# Patient Record
Sex: Male | Born: 1970 | Race: White | Hispanic: No | Smoking: Never smoker
Health system: Southern US, Community
[De-identification: ages and names within clinical notes are randomized; demographics above are authoritative.]

## PROBLEM LIST (undated history)

## (undated) DIAGNOSIS — S060XAA Concussion with loss of consciousness status unknown, initial encounter: Secondary | ICD-10-CM

## (undated) DIAGNOSIS — S060X9A Concussion with loss of consciousness of unspecified duration, initial encounter: Secondary | ICD-10-CM

## (undated) DIAGNOSIS — Z789 Other specified health status: Secondary | ICD-10-CM

## (undated) HISTORY — PX: MOUTH SURGERY: SHX715

## (undated) HISTORY — PX: TONSILLECTOMY: SUR1361

## (undated) HISTORY — DX: Concussion with loss of consciousness of unspecified duration, initial encounter: S06.0X9A

## (undated) HISTORY — DX: Concussion with loss of consciousness status unknown, initial encounter: S06.0XAA

---

## 2020-03-23 ENCOUNTER — Ambulatory Visit (HOSPITAL_COMMUNITY)
Admission: RE | Admit: 2020-03-23 | Discharge: 2020-03-23 | Disposition: A | Discharge status: Home / Self Care | Payer: No Typology Code available for payment source | Attending: Urology | Admitting: Urology

## 2020-03-23 ENCOUNTER — Emergency Department (HOSPITAL_COMMUNITY): Payer: No Typology Code available for payment source | Admitting: Certified Registered Nurse Anesthetist

## 2020-03-23 ENCOUNTER — Emergency Department (HOSPITAL_COMMUNITY): Payer: No Typology Code available for payment source

## 2020-03-23 ENCOUNTER — Encounter (HOSPITAL_COMMUNITY): Payer: Self-pay

## 2020-03-23 ENCOUNTER — Other Ambulatory Visit: Payer: Self-pay

## 2020-03-23 ENCOUNTER — Encounter (HOSPITAL_COMMUNITY)
Admission: RE | Disposition: A | Discharge status: Home / Self Care | Payer: Self-pay | Source: Home / Self Care | Attending: Emergency Medicine

## 2020-03-23 DIAGNOSIS — R52 Pain, unspecified: Secondary | ICD-10-CM | POA: Diagnosis present

## 2020-03-23 DIAGNOSIS — Z20822 Contact with and (suspected) exposure to covid-19: Secondary | ICD-10-CM | POA: Insufficient documentation

## 2020-03-23 DIAGNOSIS — S060X1A Concussion with loss of consciousness of 30 minutes or less, initial encounter: Secondary | ICD-10-CM | POA: Diagnosis present

## 2020-03-23 DIAGNOSIS — Y939 Activity, unspecified: Secondary | ICD-10-CM | POA: Insufficient documentation

## 2020-03-23 DIAGNOSIS — Z87891 Personal history of nicotine dependence: Secondary | ICD-10-CM | POA: Diagnosis not present

## 2020-03-23 DIAGNOSIS — S3022XA Contusion of scrotum and testes, initial encounter: Secondary | ICD-10-CM | POA: Diagnosis not present

## 2020-03-23 DIAGNOSIS — S3994XA Unspecified injury of external genitals, initial encounter: Secondary | ICD-10-CM | POA: Diagnosis present

## 2020-03-23 HISTORY — DX: Other specified health status: Z78.9

## 2020-03-23 HISTORY — PX: SCROTAL EXPLORATION: SHX2386

## 2020-03-23 LAB — URINALYSIS, ROUTINE W REFLEX MICROSCOPIC
Bacteria, UA: NONE SEEN
Bilirubin Urine: NEGATIVE
Glucose, UA: NEGATIVE mg/dL
Ketones, ur: NEGATIVE mg/dL
Leukocytes,Ua: NEGATIVE
Nitrite: NEGATIVE
Protein, ur: NEGATIVE mg/dL
Specific Gravity, Urine: 1.023 (ref 1.005–1.030)
pH: 6 (ref 5.0–8.0)

## 2020-03-23 LAB — CBC
HCT: 47.9 % (ref 39.0–52.0)
Hemoglobin: 15 g/dL (ref 13.0–17.0)
MCH: 27.7 pg (ref 26.0–34.0)
MCHC: 31.3 g/dL (ref 30.0–36.0)
MCV: 88.5 fL (ref 80.0–100.0)
Platelets: 207 10*3/uL (ref 150–400)
RBC: 5.41 MIL/uL (ref 4.22–5.81)
RDW: 12.9 % (ref 11.5–15.5)
WBC: 7.8 10*3/uL (ref 4.0–10.5)
nRBC: 0 % (ref 0.0–0.2)

## 2020-03-23 LAB — COMPREHENSIVE METABOLIC PANEL
ALT: 45 U/L — ABNORMAL HIGH (ref 0–44)
AST: 32 U/L (ref 15–41)
Albumin: 3.9 g/dL (ref 3.5–5.0)
Alkaline Phosphatase: 56 U/L (ref 38–126)
Anion gap: 11 (ref 5–15)
BUN: 17 mg/dL (ref 6–20)
CO2: 22 mmol/L (ref 22–32)
Calcium: 9.1 mg/dL (ref 8.9–10.3)
Chloride: 107 mmol/L (ref 98–111)
Creatinine, Ser: 1.21 mg/dL (ref 0.61–1.24)
GFR, Estimated: >60 mL/min (ref >60)
Glucose, Bld: 125 mg/dL — ABNORMAL HIGH (ref 70–99)
Potassium: 4.3 mmol/L (ref 3.5–5.1)
Sodium: 140 mmol/L (ref 135–145)
Total Bilirubin: 0.6 mg/dL (ref 0.3–1.2)
Total Protein: 6.6 g/dL (ref 6.5–8.1)

## 2020-03-23 LAB — I-STAT CHEM 8, ED
BUN: 20 mg/dL (ref 6–20)
Calcium, Ion: 1.04 mmol/L — ABNORMAL LOW (ref 1.15–1.40)
Chloride: 107 mmol/L (ref 98–111)
Creatinine, Ser: 1 mg/dL (ref 0.61–1.24)
Glucose, Bld: 122 mg/dL — ABNORMAL HIGH (ref 70–99)
HCT: 45 % (ref 39.0–52.0)
Hemoglobin: 15.3 g/dL (ref 13.0–17.0)
Potassium: 4.2 mmol/L (ref 3.5–5.1)
Sodium: 140 mmol/L (ref 135–145)
TCO2: 22 mmol/L (ref 22–32)

## 2020-03-23 LAB — PROTIME-INR
INR: 1 (ref 0.8–1.2)
Prothrombin Time: 13.1 seconds (ref 11.4–15.2)

## 2020-03-23 LAB — RESP PANEL BY RT-PCR (FLU A&B, COVID) ARPGX2
Influenza A by PCR: NEGATIVE
Influenza B by PCR: NEGATIVE
SARS Coronavirus 2 by RT PCR: NEGATIVE

## 2020-03-23 LAB — ETHANOL: Alcohol, Ethyl (B): <10 mg/dL (ref <10)

## 2020-03-23 LAB — SAMPLE TO BLOOD BANK

## 2020-03-23 SURGERY — EXPLORATION, SCROTUM
Anesthesia: General

## 2020-03-23 MED ORDER — DEXAMETHASONE SODIUM PHOSPHATE 10 MG/ML IJ SOLN
INTRAMUSCULAR | Status: AC
  Filled 2020-03-23: qty 2

## 2020-03-23 MED ORDER — BUPIVACAINE HCL (PF) 0.25 % IJ SOLN
INTRAMUSCULAR | Status: DC | PRN
  Administered 2020-03-23: 10 mL

## 2020-03-23 MED ORDER — BUPIVACAINE HCL (PF) 0.25 % IJ SOLN
INTRAMUSCULAR | Status: AC
  Filled 2020-03-23: qty 30

## 2020-03-23 MED ORDER — SODIUM CHLORIDE 0.9 % IV BOLUS
1000.0000 mL | Freq: Once | INTRAVENOUS | Status: AC
  Administered 2020-03-23: 1000 mL via INTRAVENOUS

## 2020-03-23 MED ORDER — ROCURONIUM BROMIDE 10 MG/ML (PF) SYRINGE
PREFILLED_SYRINGE | INTRAVENOUS | Status: AC
  Filled 2020-03-23: qty 30

## 2020-03-23 MED ORDER — ONDANSETRON HCL 4 MG/2ML IJ SOLN
INTRAMUSCULAR | Status: DC | PRN
  Administered 2020-03-23: 4 mg via INTRAVENOUS

## 2020-03-23 MED ORDER — ONDANSETRON HCL 4 MG/2ML IJ SOLN
INTRAMUSCULAR | Status: AC
  Filled 2020-03-23: qty 4

## 2020-03-23 MED ORDER — CHLORHEXIDINE GLUCONATE 0.12 % MT SOLN
OROMUCOSAL | Status: AC
  Administered 2020-03-23: 15 mL
  Filled 2020-03-23: qty 15

## 2020-03-23 MED ORDER — IOHEXOL 300 MG/ML  SOLN
99.0000 mL | Freq: Once | INTRAMUSCULAR | Status: AC | PRN
  Administered 2020-03-23: 99 mL via INTRAVENOUS

## 2020-03-23 MED ORDER — CEFAZOLIN SODIUM-DEXTROSE 2-3 GM-%(50ML) IV SOLR
INTRAVENOUS | Status: DC | PRN
  Administered 2020-03-23: 2 g via INTRAVENOUS

## 2020-03-23 MED ORDER — DROPERIDOL 2.5 MG/ML IJ SOLN: 0.6250 mg | Freq: Once | INTRAMUSCULAR | Status: DC | PRN

## 2020-03-23 MED ORDER — FENTANYL CITRATE (PF) 100 MCG/2ML IJ SOLN: 50.0000 ug | Freq: Once | INTRAMUSCULAR | Status: AC

## 2020-03-23 MED ORDER — LIDOCAINE 2% (20 MG/ML) 5 ML SYRINGE
INTRAMUSCULAR | Status: AC
  Filled 2020-03-23: qty 15

## 2020-03-23 MED ORDER — PHENYLEPHRINE 40 MCG/ML (10ML) SYRINGE FOR IV PUSH (FOR BLOOD PRESSURE SUPPORT)
PREFILLED_SYRINGE | INTRAVENOUS | Status: AC
  Filled 2020-03-23: qty 20

## 2020-03-23 MED ORDER — CELECOXIB 200 MG PO CAPS: 200.0000 mg | ORAL_CAPSULE | Freq: Once | ORAL | Status: AC

## 2020-03-23 MED ORDER — ACETAMINOPHEN 500 MG PO TABS
ORAL_TABLET | ORAL | Status: AC
  Administered 2020-03-23: 1000 mg via ORAL
  Filled 2020-03-23: qty 2

## 2020-03-23 MED ORDER — 0.9 % SODIUM CHLORIDE (POUR BTL) OPTIME
TOPICAL | Status: DC | PRN
  Administered 2020-03-23: 1000 mL

## 2020-03-23 MED ORDER — PROPOFOL 10 MG/ML IV BOLUS
INTRAVENOUS | Status: DC | PRN
  Administered 2020-03-23: 200 mg via INTRAVENOUS

## 2020-03-23 MED ORDER — DEXAMETHASONE SODIUM PHOSPHATE 10 MG/ML IJ SOLN
INTRAMUSCULAR | Status: DC | PRN
  Administered 2020-03-23: 10 mg via INTRAVENOUS

## 2020-03-23 MED ORDER — LIDOCAINE 2% (20 MG/ML) 5 ML SYRINGE
INTRAMUSCULAR | Status: DC | PRN
  Administered 2020-03-23: 60 mg via INTRAVENOUS

## 2020-03-23 MED ORDER — MIDAZOLAM HCL 2 MG/2ML IJ SOLN
2.0000 mg | Freq: Once | INTRAMUSCULAR | Status: AC
  Administered 2020-03-23: 2 mg via INTRAVENOUS

## 2020-03-23 MED ORDER — MIDAZOLAM HCL 2 MG/2ML IJ SOLN
INTRAMUSCULAR | Status: AC
  Filled 2020-03-23: qty 2

## 2020-03-23 MED ORDER — LACTATED RINGERS IV SOLN: INTRAVENOUS | Status: DC | PRN

## 2020-03-23 MED ORDER — EPHEDRINE 5 MG/ML INJ
INTRAVENOUS | Status: AC
  Filled 2020-03-23: qty 10

## 2020-03-23 MED ORDER — MEPERIDINE HCL 25 MG/ML IJ SOLN: 6.2500 mg | INTRAMUSCULAR | Status: DC | PRN

## 2020-03-23 MED ORDER — CEFAZOLIN SODIUM-DEXTROSE 2-4 GM/100ML-% IV SOLN
INTRAVENOUS | Status: AC
  Filled 2020-03-23: qty 100

## 2020-03-23 MED ORDER — PHENYLEPHRINE 40 MCG/ML (10ML) SYRINGE FOR IV PUSH (FOR BLOOD PRESSURE SUPPORT)
PREFILLED_SYRINGE | INTRAVENOUS | Status: AC
  Filled 2020-03-23: qty 10

## 2020-03-23 MED ORDER — FENTANYL CITRATE (PF) 100 MCG/2ML IJ SOLN
INTRAMUSCULAR | Status: AC
  Administered 2020-03-23: 50 ug via INTRAVENOUS
  Filled 2020-03-23: qty 2

## 2020-03-23 MED ORDER — ACETAMINOPHEN 500 MG PO TABS: 1000.0000 mg | ORAL_TABLET | Freq: Once | ORAL | Status: AC

## 2020-03-23 MED ORDER — FENTANYL CITRATE (PF) 250 MCG/5ML IJ SOLN
INTRAMUSCULAR | Status: DC | PRN
  Administered 2020-03-23 (×3): 50 ug via INTRAVENOUS

## 2020-03-23 MED ORDER — PROPOFOL 10 MG/ML IV BOLUS
INTRAVENOUS | Status: AC
  Filled 2020-03-23: qty 20

## 2020-03-23 MED ORDER — SUCCINYLCHOLINE CHLORIDE 200 MG/10ML IV SOSY
PREFILLED_SYRINGE | INTRAVENOUS | Status: AC
  Filled 2020-03-23: qty 10

## 2020-03-23 MED ORDER — FENTANYL CITRATE (PF) 250 MCG/5ML IJ SOLN
INTRAMUSCULAR | Status: AC
  Filled 2020-03-23: qty 5

## 2020-03-23 MED ORDER — MIDAZOLAM HCL 2 MG/2ML IJ SOLN
INTRAMUSCULAR | Status: DC | PRN
  Administered 2020-03-23: 2 mg via INTRAVENOUS

## 2020-03-23 MED ORDER — HYDROCODONE-ACETAMINOPHEN 5-325 MG PO TABS
1.0000 | ORAL_TABLET | ORAL | 0 refills | Status: DC | PRN
Start: 2020-03-23 — End: 2020-04-09

## 2020-03-23 MED ORDER — CELECOXIB 200 MG PO CAPS
ORAL_CAPSULE | ORAL | Status: AC
  Administered 2020-03-23: 200 mg via ORAL
  Filled 2020-03-23: qty 1

## 2020-03-23 MED ORDER — HYDROMORPHONE HCL 1 MG/ML IJ SOLN
0.2500 mg | INTRAMUSCULAR | Status: DC | PRN
Start: 2020-03-23 — End: 2020-03-24

## 2020-03-23 SURGICAL SUPPLY — 22 items
BLADE CLIPPER SURG (BLADE) ×3 IMPLANT
BLADE HEX COATED 2.75 (ELECTRODE) ×3 IMPLANT
COVER SURGICAL LIGHT HANDLE (MISCELLANEOUS) ×6 IMPLANT
DERMABOND ADVANCED (GAUZE/BANDAGES/DRESSINGS) ×2
DERMABOND ADVANCED .7 DNX12 (GAUZE/BANDAGES/DRESSINGS) ×1 IMPLANT
DRAPE LAPAROTOMY 100X72 PEDS (DRAPES) ×3 IMPLANT
ELECT REM PT RETURN 15FT ADLT (MISCELLANEOUS) ×3 IMPLANT
GAUZE SPONGE 4X4 12PLY STRL (GAUZE/BANDAGES/DRESSINGS) ×3 IMPLANT
GOWN STRL REUS W/ TWL LRG LVL3 (GOWN DISPOSABLE) ×1 IMPLANT
GOWN STRL REUS W/TWL LRG LVL3 (GOWN DISPOSABLE) ×2
KIT BASIN OR (CUSTOM PROCEDURE TRAY) ×3 IMPLANT
NEEDLE HYPO 22GX1.5 SAFETY (NEEDLE) ×3 IMPLANT
NS IRRIG 1000ML POUR BTL (IV SOLUTION) ×3 IMPLANT
PACK GENERAL/GYN (CUSTOM PROCEDURE TRAY) ×3 IMPLANT
SOL PREP POV-IOD 4OZ 10% (MISCELLANEOUS) ×3 IMPLANT
SUPPORT SCROTAL LG STRP (MISCELLANEOUS) IMPLANT
SUPPORTER ATHLETIC LG (MISCELLANEOUS)
SUT CHROMIC 3 0 SH 27 (SUTURE) ×3 IMPLANT
SUT VIC AB 2-0 SH 27 (SUTURE) ×2
SUT VIC AB 2-0 SH 27X BRD (SUTURE) ×1 IMPLANT
SYR CONTROL 10ML LL (SYRINGE) ×3 IMPLANT
TOWEL GREEN STERILE (TOWEL DISPOSABLE) ×6 IMPLANT

## 2020-03-23 NOTE — Transfer of Care (Signed)
Immediate Anesthesia Transfer of Care Note  Patient: Larry Raymond  Procedure(s) Performed: SCROTAL EXPLORATION (N/A )  Patient Location: PACU  Anesthesia Type:General  Level of Consciousness: drowsy and patient cooperative  Airway & Oxygen Therapy: Patient Spontanous Breathing  Post-op Assessment: Report given to RN, Post -op Vital signs reviewed and stable and Patient moving all extremities X 4  Post vital signs: Reviewed and stable  Last Vitals:  Vitals Value Taken Time  BP 147/95 03/23/20 1558  Temp    Pulse 95 03/23/20 1604  Resp 12 03/23/20 1604  SpO2 97 % 03/23/20 1604  Vitals shown include unvalidated device data.  Last Pain:  Vitals:   03/23/20 1411  TempSrc:   PainSc: 6       Patients Stated Pain Goal: 3 (03/23/20 1411)  Complications: No complications documented.   Dr. Chaney Malling at bedside to assess patient. Slow to respond, HR ST in 120s , RR 30s, O2 sats 100%, BP stable.
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
## 2020-03-23 NOTE — ED Provider Notes (Signed)
Pt signed out by Dr. Bebe Shaggy pending CT results.    Pt is now completely with it mentally.  GCS 15.  IMPRESSION:  CT cervical spine:    1. Mild height loss of the anterior T1 vertebral body with small  ossific fragment along the anterior/superior aspect of the vertebral  body. While this could represent degenerative remodeling/change, age  indeterminate fracture is not excluded in the setting of trauma. An  MRI of the cervical spine could evaluate for associated bone marrow  edema if clinically indicated.  2. Normal alignment.  3. Left eccentric uncovertebral hypertrophy at C6-C7 with resulting  foraminal narrowing.    CT head:    No evidence of acute intracranial abnormality.    Findings discussed with Dr. Particia Nearing via telephone at 8:19 AM.    I reexamined pt's neck.  He has zero tenderness at T1, so I suspect it is an old injury.  I removed his c-collar.  Pt also c/o some low back pain and some left testicle pain.  On exam, he has coccyx tenderness.  He has left testicle pain with palpation and swelling.  So, I ordered a CT abd/pelvis/l-spine and Scrotal US.  CT L-spine:    IMPRESSION:  Negative for lumbar spine fracture.   CT abd-pelvis:  IMPRESSION:  No CT evidence of acute traumatic injury to the abdomen or pelvis.   US scrotum:    IMPRESSION:  Mildly heterogeneous LEFT testis with several small subtunica  collections likely representing subcapsular hematoma.    Associated reactive hyperemia of LEFT testis and small LEFT  hematocele.    Hemorrhage adjacent to inferior pole of LEFT testis with poorly  defined tunica at the inferior pole concerning for testicular  rupture.    Enlargement and heterogeneity of the LEFT epididymal tail, portions  of which contain internal blood flow and additional portions of  which do not, likely representing a combination of traumatic  epididymitis and hemorrhage.    Unremarkable RIGHT testis and RIGHT epididymis.       Pt d/w Dr. Alvester Morin (urology) who [facility] see him and take him to the OR.  Pt has not wanted any pain meds.  Covid pending.  He's been vaccinated, but no booster.        Jacalyn Lefevre, MD 03/23/20 1040

## 2020-03-23 NOTE — Anesthesia Preprocedure Evaluation (Addendum)
Anesthesia Evaluation  Patient identified by MRN, date of birth, ID band Patient awake    Reviewed: Allergy & Precautions, NPO status , Patient's Chart, lab work & pertinent test results  Airway Mallampati: II  TM Distance: >3 FB Neck ROM: Full    Dental no notable dental hx.    Pulmonary neg pulmonary ROS,    Pulmonary exam normal breath sounds clear to auscultation       Cardiovascular negative cardio ROS Normal cardiovascular exam Rhythm:Regular Rate:Normal     Neuro/Psych negative neurological ROS     GI/Hepatic negative GI ROS, Neg liver ROS,   Endo/Other  negative endocrine ROS  Renal/GU negative Renal ROS     Musculoskeletal negative musculoskeletal ROS (+)   Abdominal   Peds  Hematology negative hematology ROS (+)   Anesthesia Other Findings   Reproductive/Obstetrics                            Anesthesia Physical Anesthesia Plan  ASA: III  Anesthesia Plan: General   Post-op Pain Management:    Induction: Intravenous  PONV Risk Score and Plan: 4 or greater and Ondansetron, Dexamethasone, Midazolam, Treatment may vary due to age or medical condition and Diphenhydramine  Airway Management Planned: LMA  Additional Equipment: None  Intra-op Plan:   Post-operative Plan: Extubation in OR  Informed Consent: I have reviewed the patients History and Physical, chart, labs and discussed the procedure including the risks, benefits and alternatives for the proposed anesthesia with the patient or authorized representative who has indicated his/her understanding and acceptance.     Dental advisory given  Plan Discussed with: CRNA  Anesthesia Plan Comments:       Anesthesia Quick Evaluation
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
## 2020-03-23 NOTE — ED Notes (Signed)
Patient transported to CT/Xray. 
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
## 2020-03-23 NOTE — Anesthesia Procedure Notes (Signed)
Procedure Name: LMA Insertion Date/Time: 03/23/2020 3:04 PM Performed by: Alease Medina, CRNA Pre-anesthesia Checklist: Patient identified, Emergency Drugs available, Suction available and Patient being monitored Patient Re-evaluated:Patient Re-evaluated prior to induction Oxygen Delivery Method: Circle system utilized Preoxygenation: Pre-oxygenation with 100% oxygen Induction Type: IV induction Ventilation: Mask ventilation without difficulty LMA: LMA inserted LMA Size: 5.0 Number of attempts: 1 Airway Equipment and Method: Stylet Placement Confirmation: positive ETCO2,  breath sounds checked- equal and bilateral and CO2 detector Tube secured with: Tape Dental Injury: Teeth and Oropharynx as per pre-operative assessment

## 2020-03-23 NOTE — Discharge Instructions (Addendum)
Discharge instructions following scrotal surgery  Call your doctor for: Fever is greater than 100.5 Severe nausea or vomiting Increasing pain not controlled by pain medication Increasing redness or drainage from incisions  The number for questions or concerns is [phone]  Activity level: No lifting greater than 20 pounds (about equal to milk) for the next 2 weeks or until cleared to do so at follow-up appointment.  Otherwise activity as tolerated by comfort level.  Diet: May resume your regular diet as tolerated  Driving: No driving while still taking opiate pain medications (weight at least 6-8 hours after last dose).  No driving if you still sore from surgery as it may limit her ability to react quickly if necessary.   Shower/bath: May shower and get incision wet pad dry immediately following.  Do not scrub vigorously for the next 2-3 weeks.  Do not soak incision (ID soaking in bath or swimming) until told he may do so by Dr., as this may promote a wound infection.  Wound care: He may cover wounds with sterile gauze as needed to prevent incisions rubbing on close follow-up in any seepage.  Where tight fitting underpants/scrotal support for at least 2 weeks.  He should apply cold compresses (ice or sac of frozen peas/corn) to your scrotum for at least 48 hours to reduce the swelling for 15 minutes at a time indirectly.  You should expect that his scrotum Garvin swell up initially and then get smaller over the next 2-4 weeks.  Follow-up appointments: Follow-up appointment Lamberto be scheduled with Dr. Bell for a wound check.  

## 2020-03-24 ENCOUNTER — Encounter (HOSPITAL_COMMUNITY): Payer: Self-pay | Admitting: Urology

## 2020-03-24 NOTE — Anesthesia Postprocedure Evaluation (Signed)
Anesthesia Post Note  Patient: Julis Waunita Schooner  Procedure(s) Performed: SCROTAL EXPLORATION (N/A )     Patient location during evaluation: PACU Anesthesia Type: General Level of consciousness: awake and alert Pain management: pain level controlled Vital Signs Assessment: post-procedure vital signs reviewed and stable Respiratory status: spontaneous breathing, nonlabored ventilation, respiratory function stable and patient connected to nasal cannula oxygen Cardiovascular status: blood pressure returned to baseline and stable Postop Assessment: no apparent nausea or vomiting Anesthetic complications: no   No complications documented.  Last Vitals:  Vitals:   03/23/20 1615 03/23/20 1637  BP: (!) 139/95 (P) 129/85  Pulse: 96   Resp: 13   Temp:    SpO2: 100%     Last Pain:  Vitals:   03/23/20 1637  TempSrc:   PainSc: (P) 0-No pain                 Tc Kapusta S

## 2020-04-02 ENCOUNTER — Telehealth: Payer: Self-pay | Admitting: Family Medicine

## 2020-04-02 DIAGNOSIS — H9313 Tinnitus, bilateral: Secondary | ICD-10-CM

## 2020-04-02 DIAGNOSIS — R519 Headache, unspecified: Secondary | ICD-10-CM

## 2020-04-02 DIAGNOSIS — R102 Pelvic and perineal pain: Secondary | ICD-10-CM

## 2020-04-02 MED ORDER — IBUPROFEN 600 MG PO TABS: 600.0000 mg | ORAL_TABLET | Freq: Three times a day (TID) | ORAL | 0 refills | Status: DC | PRN

## 2020-04-02 MED ORDER — ACETAMINOPHEN 500 MG PO TABS: 500.0000 mg | ORAL_TABLET | Freq: Four times a day (QID) | ORAL | 0 refills | Status: DC | PRN
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
## 2020-04-09 ENCOUNTER — Encounter: Payer: Self-pay | Admitting: Nurse Practitioner

## 2020-04-09 ENCOUNTER — Other Ambulatory Visit: Payer: Self-pay

## 2020-04-09 ENCOUNTER — Ambulatory Visit (INDEPENDENT_AMBULATORY_CARE_PROVIDER_SITE_OTHER): Payer: Self-pay | Admitting: Nurse Practitioner

## 2020-04-09 VITALS — BP 141/89 | HR 95 | Temp 98.4°F | Ht 74.5 in | Wt 236.0 lb

## 2020-04-09 DIAGNOSIS — R519 Headache, unspecified: Secondary | ICD-10-CM

## 2020-04-09 DIAGNOSIS — S060X9S Concussion with loss of consciousness of unspecified duration, sequela: Secondary | ICD-10-CM

## 2020-04-09 DIAGNOSIS — H9313 Tinnitus, bilateral: Secondary | ICD-10-CM

## 2020-04-09 DIAGNOSIS — R102 Pelvic and perineal pain: Secondary | ICD-10-CM
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
## 2020-04-09 NOTE — Patient Instructions (Signed)
Post-Concussion Syndrome  A concussion is a brain injury. Post-concussion syndrome is when symptoms last longer than normal after a head injury. What are the causes? The cause of this condition is not known. It can happen if your head injury was mild or very bad. What increases the risk?  Being male.  Being young.  Having had a head injury before.  Being sad (depressed) or feeling worried or nervous (having anxiety).  Fainting when you got your concussion or not being able to remember it.  Having many symptoms or very bad symptoms at the time of your injury. What are the signs or symptoms? After a head injury, you may have physical symptoms, such as:  Having headaches.  Feeling tired.  Feeling dizzy.  Feeling weak.  Having trouble seeing.  Having trouble in bright lights.  Having trouble hearing.  Having problems balancing. You may also have mental and emotional symptoms, such as:  Not being able to remember things.  Not being able to focus.  Having trouble sleeping.  Feeling grouchy (irritable).  Feeling worried.  Feeling sad.  Having trouble learning new things. These can last from weeks to months. How is this treated? Treatment for this condition may depend on your symptoms. Symptoms usually go away on their own over time. If you need treatment, it may include:  Taking medicines.  Resting your brain and body for a few days.  Doing therapy to help you recover (rehabilitation therapy). This may include: ? Physical or occupational therapy. This may include exercises. ? Talking to a counselor. ? Speech therapy. ? Therapy to help your eyes. Follow these instructions at home: Medicines  Take all medicines only as told by your doctor.  Avoid pain medicines that have opioids in them. Ask your doctor which pain medicine to take. Activity  Limit activities as told by your doctor. This may include not doing the following: ? Homework. ? Job-related  work. ? Hard thinking. ? Watching TV. ? Using a computer or phone. ? Puzzles. ? Exercise. ? Sports.  Slowly return to your normal activity as told by your doctor.  Stop an activity if you have symptoms.  Rest. Try to: ? Sleep 7-9 hours each night. ? Take naps or breaks when you feel tired during the day.  Do not do anything that may cause you to get hurt again right away. General instructions  Do not drink alcohol until your doctor says that you can.  Keep track of your symptoms.  Keep all follow-up visits as told by your doctor. This is important.   Contact a doctor if:  You do not improve.  You get worse.  You have another injury. Get help right away if:  You have a very bad headache or a headache that gets worse.  You feel confused.  You feel very sleepy.  You faint.  You vomit.  You feel weak in any part of your body.  You feel numb in any part of your body.  You start shaking (have a seizure).  You have trouble talking. Summary  This condition is when symptoms last longer than normal after a head injury.  Limit your activities after your injury. Slowly return to normal activities as told by your doctor.  Rest.  Do not drink alcohol and avoid pain medicines that have opioids in them.  Call your doctor if your symptoms get worse. This information is not intended to replace advice given to you by your health care provider. Make sure you  discuss any questions you have with your health care provider. Document Revised: 01/15/2019 Document Reviewed: 01/15/2019 Elsevier Patient Education  2021 Elsevier Inc.  
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
## 2020-04-12 ENCOUNTER — Encounter: Payer: Self-pay | Admitting: Nurse Practitioner

## 2020-04-13 ENCOUNTER — Telehealth: Payer: Self-pay

## 2020-04-13 DIAGNOSIS — S060X9S Concussion with loss of consciousness of unspecified duration, sequela: Secondary | ICD-10-CM

## 2020-04-13 NOTE — Telephone Encounter (Signed)
Pt has not received the call from the specialist.

## 2020-04-14 ENCOUNTER — Telehealth: Payer: Self-pay | Admitting: Family Medicine
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
## 2020-04-14 NOTE — Addendum Note (Signed)
Addended by: Judd Gaudier on: 04/14/2020 02:35 PM   Modules accepted: Orders

## 2020-04-14 NOTE — Telephone Encounter (Signed)
Hi [name] were you able to call [name]

## 2020-04-14 NOTE — Telephone Encounter (Signed)
Referral to [facility] sent.  They Imad contact patient.

## 2020-04-15 ENCOUNTER — Ambulatory Visit: Payer: Self-pay | Admitting: Neurology

## 2020-04-15 ENCOUNTER — Encounter: Payer: Self-pay | Admitting: Neurology

## 2020-04-15 VITALS — BP 123/77 | HR 76 | Ht 74.3 in | Wt 236.0 lb

## 2020-04-15 DIAGNOSIS — H9313 Tinnitus, bilateral: Secondary | ICD-10-CM
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
## 2020-04-15 NOTE — Telephone Encounter (Signed)
Called pt and he states that he was just seen by neurology today and he was cleared to RTW.  At this point, pt would like to hold off on being seen.  He states that his only issue is some residual tinnitus but reports having already had issues w/ his hearing and prior tinnitus so neurology wasn't overly concerned.  Advise him that if he begins to have new issues upon attempting to RTW, that he can call to schedule at that time.  Pt agrees and verbalizes understanding.

## 2020-05-17 ENCOUNTER — Encounter: Payer: Self-pay | Admitting: Nurse Practitioner

## 2020-05-17 ENCOUNTER — Other Ambulatory Visit: Payer: Self-pay

## 2020-05-17 ENCOUNTER — Ambulatory Visit (INDEPENDENT_AMBULATORY_CARE_PROVIDER_SITE_OTHER): Payer: Self-pay | Admitting: Nurse Practitioner

## 2020-05-17 VITALS — BP 128/96 | HR 75 | Temp 99.3°F | Ht 74.3 in | Wt 235.0 lb

## 2020-05-17 DIAGNOSIS — Z125 Encounter for screening for malignant neoplasm of prostate: Secondary | ICD-10-CM

## 2020-05-17 DIAGNOSIS — Z1159 Encounter for screening for other viral diseases: Secondary | ICD-10-CM

## 2020-05-17 DIAGNOSIS — B360 Pityriasis versicolor: Secondary | ICD-10-CM

## 2020-05-17 DIAGNOSIS — Z114 Encounter for screening for human immunodeficiency virus [HIV]: Secondary | ICD-10-CM

## 2020-05-17 DIAGNOSIS — Z131 Encounter for screening for diabetes mellitus: Secondary | ICD-10-CM

## 2020-05-17 DIAGNOSIS — Z13220 Encounter for screening for lipoid disorders: Secondary | ICD-10-CM

## 2020-05-17 DIAGNOSIS — Z0000 Encounter for general adult medical examination without abnormal findings: Secondary | ICD-10-CM

## 2020-05-17 NOTE — Patient Instructions (Addendum)
Health Maintenance, Male Adopting a healthy lifestyle and getting preventive care are important in promoting health and wellness. Ask your health care provider about:  The right schedule for you to have regular tests and exams.  Things you can do on your own to prevent diseases and keep yourself healthy. What should I know about diet, weight, and exercise? Eat a healthy diet  Eat a diet that includes plenty of vegetables, fruits, low-fat dairy products, and lean protein.  Do not eat a lot of foods that are high in solid fats, added sugars, or sodium.   Maintain a healthy weight Body mass index (BMI) is a measurement that can be used to identify possible weight problems. It estimates body fat based on height and weight. Your health care provider can help determine your BMI and help you achieve or maintain a healthy weight. Get regular exercise Get regular exercise. This is one of the most important things you can do for your health. Most adults should:  Exercise for at least 150 minutes each week. The exercise should increase your heart rate and make you sweat (moderate-intensity exercise).  Do strengthening exercises at least twice a week. This is in addition to the moderate-intensity exercise.  Spend less time sitting. Even light physical activity can be beneficial. Watch cholesterol and blood lipids Have your blood tested for lipids and cholesterol at 48 years of age, then have this test every 5 years. You may need to have your cholesterol levels checked more often if:  Your lipid or cholesterol levels are high.  You are older than 48 years of age.  You are at high risk for heart disease. What should I know about cancer screening? Many types of cancers can be detected early and may often be prevented. Depending on your health history and family history, you may need to have cancer screening at various ages. This may include screening for:  Colorectal cancer.  Prostate  cancer.  Skin cancer.  Lung cancer. What should I know about heart disease, diabetes, and high blood pressure? Blood pressure and heart disease  High blood pressure causes heart disease and increases the risk of stroke. This is more likely to develop in people who have high blood pressure readings, are of African descent, or are overweight.  Talk with your health care provider about your target blood pressure readings.  Have your blood pressure checked: ? Every 3-5 years if you are 18-39 years of age. ? Every year if you are 40 years old or older.  If you are between the ages of 65 and 75 and are a current or former smoker, ask your health care provider if you should have a one-time screening for abdominal aortic aneurysm (AAA). Diabetes Have regular diabetes screenings. This checks your fasting blood sugar level. Have the screening done:  Once every three years after age 45 if you are at a normal weight and have a low risk for diabetes.  More often and at a younger age if you are overweight or have a high risk for diabetes. What should I know about preventing infection? Hepatitis B If you have a higher risk for hepatitis B, you should be screened for this virus. Talk with your health care provider to find out if you are at risk for hepatitis B infection. Hepatitis C Blood testing is recommended for:  Everyone born from 1945 through 1965.  Anyone with known risk factors for hepatitis C. Sexually transmitted infections (STIs)  You should be screened each   year for STIs, including gonorrhea and chlamydia, if: ? You are sexually active and are younger than 48 years of age. ? You are older than 48 years of age and your health care provider tells you that you are at risk for this type of infection. ? Your sexual activity has changed since you were last screened, and you are at increased risk for chlamydia or gonorrhea. Ask your health care provider if you are at risk.  Ask your  health care provider about whether you are at high risk for HIV. Your health care provider may recommend a prescription medicine to help prevent HIV infection. If you choose to take medicine to prevent HIV, you should first get tested for HIV. You should then be tested every 3 months for as long as you are taking the medicine. Follow these instructions at home: Lifestyle  Do not use any products that contain nicotine or tobacco, such as cigarettes, e-cigarettes, and chewing tobacco. If you need help quitting, ask your health care provider.  Do not use street drugs.  Do not share needles.  Ask your health care provider for help if you need support or information about quitting drugs. Alcohol use  Do not drink alcohol if your health care provider tells you not to drink.  If you drink alcohol: ? Limit how much you have to 0-2 drinks a day. ? Be aware of how much alcohol is in your drink. In the [place] one drink equals one 12 oz bottle of beer (355 mL), one 5 oz glass of wine (148 mL), or one 1 oz glass of hard liquor (44 mL). General instructions  Schedule regular health, dental, and eye exams.  Stay current with your vaccines.  Tell your health care provider if: ? You often feel depressed. ? You have ever been abused or do not feel safe at home. Summary  Adopting a healthy lifestyle and getting preventive care are important in promoting health and wellness.  Follow your health care provider's instructions about healthy diet, exercising, and getting tested or screened for diseases.  Follow your health care provider's instructions on monitoring your cholesterol and blood pressure. This information is not intended to replace advice given to you by your health care provider. Make sure you discuss any questions you have with your health care provider. Document Revised: 01/16/2018 Document Reviewed: 01/16/2018 Elsevier Patient Education  2021 Elsevier Inc.  Tinnitus Tinnitus refers to  hearing a sound when there is no actual source for that sound. This is often described as ringing in the ears. However, people with this condition may hear a variety of noises, in one ear or in both ears. The sounds of tinnitus can be soft, loud, or somewhere in between. Tinnitus can last for a few seconds or can be constant for days. It may go away without treatment and come back at various times. When tinnitus is constant or happens often, it can lead to other problems, such as trouble sleeping and trouble concentrating. Almost everyone experiences tinnitus at some point. Tinnitus that is long-lasting (chronic) or comes back often (recurs) may require medical attention. What are the causes? The cause of tinnitus is often not known. In some cases, it can result from:  Exposure to loud noises from machinery, music, or other sources.  An object (foreign body) stuck in the ear.  Earwax buildup.  Drinking alcohol or caffeine.  Taking certain medicines.  Age-related hearing loss. It may also be caused by medical conditions such as:  Ear or sinus infections.  High blood pressure.  Heart diseases.  Anemia.  Allergies.  Meniere's disease.  Thyroid problems.  Tumors.  A weak, bulging blood vessel (aneurysm) near the ear. What are the signs or symptoms? The main symptom of tinnitus is hearing a sound when there is no source for that sound. It may sound like:  Buzzing.  Roaring.  Ringing.  Blowing air.  Hissing.  Whistling.  Sizzling.  Humming.  Running water.  A musical note.  Tapping. Symptoms may affect only one ear (unilateral) or both ears (bilateral). How is this diagnosed? Tinnitus is diagnosed based on your symptoms, your medical history, and a physical exam. Your health care provider may do a thorough hearing test (audiologic exam) if your tinnitus:  Is unilateral.  Causes hearing difficulties.  Lasts 6 months or longer. You may work with a health  care provider who specializes in hearing disorders (audiologist). You may be asked questions about your symptoms and how they affect your daily life. You may have other tests done, such as:  CT scan.  MRI.  An imaging test of how blood flows through your blood vessels (angiogram). How is this treated? Treating an underlying medical condition can sometimes make tinnitus go away. If your tinnitus continues, other treatments may include:  Medicines.  Therapy and counseling to help you manage the stress of living with tinnitus.  Sound generators to mask the tinnitus. These include: ? Tabletop sound machines that play relaxing sounds to help you fall asleep. ? Wearable devices that fit in your ear and play sounds or music. ? Acoustic neural stimulation. This involves using headphones to listen to music that contains an auditory signal. Over time, listening to this signal may change some pathways in your brain and make you less sensitive to tinnitus. This treatment is used for very severe cases when no other treatment is working.  Using hearing aids or cochlear implants if your tinnitus is related to hearing loss. Hearing aids are worn in the outer ear. Cochlear implants are surgically placed in the inner ear. Follow these instructions at home: Managing symptoms  When possible, avoid being in loud places and being exposed to loud sounds.  Wear hearing protection, such as earplugs, when you are exposed to loud noises.  Use a white noise machine, a humidifier, or other devices to mask the sound of tinnitus.  Practice techniques for reducing stress, such as meditation, yoga, or deep breathing. Work with your health care provider if you need help with managing stress.  Sleep with your head slightly raised. This may reduce the impact of tinnitus.      General instructions  Do not use stimulants, such as nicotine, alcohol, or caffeine. Talk with your health care provider about other  stimulants to avoid. Stimulants are substances that can make you feel alert and attentive by increasing certain activities in the body (such as heart rate and blood pressure). These substances may make tinnitus worse.  Take over-the-counter and prescription medicines only as told by your health care provider.  Try to get plenty of sleep each night.  Keep all follow-up visits as told by your health care provider. This is important. Contact a health care provider if:  Your tinnitus continues for 3 weeks or longer without stopping.  You develop sudden hearing loss.  Your symptoms get worse or do not get better with home care.  You feel you are not able to manage the stress of living with tinnitus. Get help  right away if:  You develop tinnitus after a head injury.  You have tinnitus along with any of the following: ? Dizziness. ? Loss of balance. ? Nausea and vomiting. ? Sudden, severe headache. These symptoms may represent a serious problem that is an emergency. Do not wait to see if the symptoms Alegandro go away. Get medical help right away. Call your local emergency services (911 in the [place] Do not drive yourself to the hospital. Summary  Tinnitus refers to hearing a sound when there is no actual source for that sound. This is often described as ringing in the ears.  Symptoms may affect only one ear (unilateral) or both ears (bilateral).  Use a white noise machine, a humidifier, or other devices to mask the sound of tinnitus.  Do not use stimulants, such as nicotine, alcohol, or caffeine. Talk with your health care provider about other stimulants to avoid. These substances may make tinnitus worse. This information is not intended to replace advice given to you by your health care provider. Make sure you discuss any questions you have with your health care provider. Document Revised: 08/06/2018 Document Reviewed: 11/02/2016 Elsevier Patient Education  2021 [employer].
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
## 2020-05-21 ENCOUNTER — Encounter: Payer: Self-pay | Admitting: Nurse Practitioner

## 2020-06-07 ENCOUNTER — Other Ambulatory Visit: Payer: Self-pay

## 2020-06-07 DIAGNOSIS — Z125 Encounter for screening for malignant neoplasm of prostate: Secondary | ICD-10-CM

## 2020-06-07 DIAGNOSIS — Z114 Encounter for screening for human immunodeficiency virus [HIV]: Secondary | ICD-10-CM

## 2020-06-07 DIAGNOSIS — Z131 Encounter for screening for diabetes mellitus: Secondary | ICD-10-CM

## 2020-06-07 DIAGNOSIS — Z13220 Encounter for screening for lipoid disorders: Secondary | ICD-10-CM

## 2020-06-07 DIAGNOSIS — Z1159 Encounter for screening for other viral diseases: Secondary | ICD-10-CM

## 2020-06-08 LAB — COMP. METABOLIC PANEL (12)
AST: 23 IU/L (ref 0–40)
Albumin/Globulin Ratio: 2.2 (ref 1.2–2.2)
Albumin: 4.6 g/dL (ref 4.0–5.0)
Alkaline Phosphatase: 82 IU/L (ref 44–121)
BUN/Creatinine Ratio: 13 (ref 9–20)
BUN: 14 mg/dL (ref 6–24)
Bilirubin Total: 0.3 mg/dL (ref 0.0–1.2)
Calcium: 9.2 mg/dL (ref 8.7–10.2)
Chloride: 101 mmol/L (ref 96–106)
Creatinine, Ser: 1.07 mg/dL (ref 0.76–1.27)
Globulin, Total: 2.1 g/dL (ref 1.5–4.5)
Glucose: 99 mg/dL (ref 65–99)
Potassium: 4.5 mmol/L (ref 3.5–5.2)
Sodium: 136 mmol/L (ref 134–144)
Total Protein: 6.7 g/dL (ref 6.0–8.5)
eGFR: 86 mL/min/{1.73_m2} (ref >59)

## 2020-06-08 LAB — LIPID PANEL
Chol/HDL Ratio: 4.9 ratio (ref 0.0–5.0)
Cholesterol, Total: 187 mg/dL (ref 100–199)
HDL: 38 mg/dL — ABNORMAL LOW (ref >39)
LDL Chol Calc (NIH): 121 mg/dL — ABNORMAL HIGH (ref 0–99)
Triglycerides: 156 mg/dL — ABNORMAL HIGH (ref 0–149)
VLDL Cholesterol Cal: 28 mg/dL (ref 5–40)

## 2020-06-08 LAB — HEPATITIS C ANTIBODY: Hep C Virus Ab: <0.1 s/co ratio (ref 0.0–0.9)

## 2020-06-08 LAB — PSA: Prostate Specific Ag, Serum: 0.8 ng/mL (ref 0.0–4.0)

## 2020-06-08 LAB — HIV ANTIBODY (ROUTINE TESTING W REFLEX): HIV Screen 4th Generation wRfx: NONREACTIVE
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
## 2021-08-12 ENCOUNTER — Ambulatory Visit (INDEPENDENT_AMBULATORY_CARE_PROVIDER_SITE_OTHER): Payer: No Typology Code available for payment source | Admitting: Nurse Practitioner

## 2021-08-12 ENCOUNTER — Encounter: Payer: Self-pay | Admitting: Nurse Practitioner

## 2021-08-12 VITALS — Ht 74.3 in | Wt 235.0 lb

## 2021-08-12 DIAGNOSIS — Z1211 Encounter for screening for malignant neoplasm of colon: Secondary | ICD-10-CM

## 2021-08-12 DIAGNOSIS — Z0000 Encounter for general adult medical examination without abnormal findings: Secondary | ICD-10-CM

## 2021-08-12 DIAGNOSIS — Z1283 Encounter for screening for malignant neoplasm of skin: Secondary | ICD-10-CM

## 2021-08-12 NOTE — Patient Instructions (Signed)
1. Skin cancer screening  - Ambulatory referral to Dermatology  2. Colon cancer screening  - Ambulatory referral to Gastroenterology  3. Routine adult health maintenance  - Ambulatory referral to Gastroenterology - CBC; Future - Comprehensive metabolic panel; Future - Lipid Panel; Future - Hemoglobin A1c; Future  Follow up:  Follow up in 1 year or sooner if needed

## 2021-08-12 NOTE — Progress Notes (Signed)
Virtual Visit via Telephone Note  I connected with Yair Gorgas on 08/12/21 at  2:20 PM EDT by telephone and verified that I am speaking with the correct person using two identifiers.  Location: Patient: home Provider: office   I discussed the limitations, risks, security and privacy concerns of performing an evaluation and management service by telephone and the availability of in person appointments. I also discussed with the patient that there may be a patient responsible charge related to this service. The patient expressed understanding and agreed to proceed.   History of Present Illness:  Patient presents today for a telephone visit for physical.  Patient states that overall he has been doing well.  He is concerned about a irregular mole to the left side of his face.  He states that it has become rough and scaly and has changed shape.  We discussed that we [facility] refer him to dermatology for skin cancer screening.  Patient is over 46 and is due for colonoscopy.  He is agreeable we [facility] referral for this. Denies f/c/s, n/v/d, hemoptysis, PND, leg swelling Denies chest pain or edema      Observations/Objective:     08/12/2021    2:15 PM 05/17/2020    8:24 AM 04/15/2020   11:00 AM  Vitals with BMI  Height 6' 2.3" 6' 2.3" 6' 2.3"  Weight 235 lbs 235 lbs 236 lbs  BMI 29.94 29.94 30.06  Systolic  128 123  Diastolic  96 77  Pulse  75 76     Assessment and Plan:  1. Skin cancer screening  - Ambulatory referral to Dermatology  2. Colon cancer screening  - Ambulatory referral to Gastroenterology  3. Routine adult health maintenance  - Ambulatory referral to Gastroenterology - CBC; Future - Comprehensive metabolic panel; Future - Lipid Panel; Future - Hemoglobin A1c; Future  Follow up:  Follow up in 1 year or sooner if needed    I discussed the assessment and treatment plan with the patient. The patient was provided an opportunity to ask questions and all were  answered. The patient agreed with the plan and demonstrated an understanding of the instructions.   The patient was advised to call back or seek an in-person evaluation if the symptoms worsen or if the condition fails to improve as anticipated.  I provided 23 minutes of non-face-to-face time during this encounter.   Ivonne Andrew, NP

## 2021-08-26 ENCOUNTER — Other Ambulatory Visit: Payer: No Typology Code available for payment source

## 2021-08-26 DIAGNOSIS — Z0000 Encounter for general adult medical examination without abnormal findings: Secondary | ICD-10-CM

## 2021-08-27 LAB — COMPREHENSIVE METABOLIC PANEL
ALT: 26 IU/L (ref 0–44)
AST: 16 IU/L (ref 0–40)
Albumin/Globulin Ratio: 1.8 (ref 1.2–2.2)
Albumin: 4.4 g/dL (ref 4.1–5.1)
Alkaline Phosphatase: 64 IU/L (ref 44–121)
BUN/Creatinine Ratio: 11 (ref 9–20)
BUN: 13 mg/dL (ref 6–24)
Bilirubin Total: 0.4 mg/dL (ref 0.0–1.2)
CO2: 22 mmol/L (ref 20–29)
Calcium: 9.1 mg/dL (ref 8.7–10.2)
Chloride: 103 mmol/L (ref 96–106)
Creatinine, Ser: 1.15 mg/dL (ref 0.76–1.27)
Globulin, Total: 2.5 g/dL (ref 1.5–4.5)
Glucose: 98 mg/dL (ref 70–99)
Potassium: 4.6 mmol/L (ref 3.5–5.2)
Sodium: 141 mmol/L (ref 134–144)
Total Protein: 6.9 g/dL (ref 6.0–8.5)
eGFR: 78 mL/min/{1.73_m2} (ref >59)

## 2021-08-27 LAB — CBC
Hematocrit: 44.7 % (ref 37.5–51.0)
Hemoglobin: 14.8 g/dL (ref 13.0–17.7)
MCH: 28.4 pg (ref 26.6–33.0)
MCHC: 33.1 g/dL (ref 31.5–35.7)
MCV: 86 fL (ref 79–97)
Platelets: 196 10*3/uL (ref 150–450)
RBC: 5.21 x10E6/uL (ref 4.14–5.80)
RDW: 13.1 % (ref 11.6–15.4)
WBC: 7.1 10*3/uL (ref 3.4–10.8)

## 2021-08-27 LAB — LIPID PANEL
Chol/HDL Ratio: 4.8 ratio (ref 0.0–5.0)
Cholesterol, Total: 182 mg/dL (ref 100–199)
HDL: 38 mg/dL — ABNORMAL LOW (ref >39)
LDL Chol Calc (NIH): 125 mg/dL — ABNORMAL HIGH (ref 0–99)
Triglycerides: 102 mg/dL (ref 0–149)
VLDL Cholesterol Cal: 19 mg/dL (ref 5–40)

## 2021-08-27 LAB — HEMOGLOBIN A1C
Est. average glucose Bld gHb Est-mCnc: 120 mg/dL
Hgb A1c MFr Bld: 5.8 % — ABNORMAL HIGH (ref 4.8–5.6)

## 2022-08-14 ENCOUNTER — Encounter: Payer: No Typology Code available for payment source | Admitting: Nurse Practitioner
